# Patient Record
Sex: Female | Born: 1970 | Race: Black or African American | Hispanic: No | Marital: Married | Smoking: Never smoker
Health system: Southern US, Community
[De-identification: ages and names within clinical notes are randomized; demographics above are authoritative.]

## PROBLEM LIST (undated history)

## (undated) DIAGNOSIS — I10 Essential (primary) hypertension: Secondary | ICD-10-CM

## (undated) HISTORY — PX: NO PAST SURGERIES: SHX2092

---

## 1998-03-11 ENCOUNTER — Other Ambulatory Visit: Admission: RE | Admit: 1998-03-11 | Discharge: 1998-03-11 | Payer: Self-pay | Admitting: Obstetrics and Gynecology

## 2002-05-29 ENCOUNTER — Other Ambulatory Visit: Admission: RE | Admit: 2002-05-29 | Discharge: 2002-05-29 | Payer: Self-pay | Admitting: Obstetrics and Gynecology

## 2002-05-31 ENCOUNTER — Encounter: Admission: RE | Admit: 2002-05-31 | Discharge: 2002-05-31 | Payer: Self-pay | Admitting: Obstetrics and Gynecology

## 2002-05-31 ENCOUNTER — Encounter: Payer: Self-pay | Admitting: Obstetrics and Gynecology

## 2002-08-27 ENCOUNTER — Ambulatory Visit (HOSPITAL_COMMUNITY): Admission: RE | Admit: 2002-08-27 | Discharge: 2002-08-27 | Payer: Self-pay | Admitting: Obstetrics and Gynecology

## 2002-08-27 ENCOUNTER — Encounter: Payer: Self-pay | Admitting: Obstetrics and Gynecology

## 2003-03-22 ENCOUNTER — Ambulatory Visit (HOSPITAL_COMMUNITY): Admission: RE | Admit: 2003-03-22 | Discharge: 2003-03-22 | Payer: Self-pay | Admitting: Obstetrics and Gynecology

## 2003-03-22 ENCOUNTER — Encounter: Payer: Self-pay | Admitting: Obstetrics and Gynecology

## 2003-05-21 ENCOUNTER — Encounter: Admission: RE | Admit: 2003-05-21 | Discharge: 2003-08-19 | Payer: Self-pay | Admitting: Gynecology

## 2003-05-27 ENCOUNTER — Inpatient Hospital Stay (HOSPITAL_COMMUNITY): Admission: AD | Admit: 2003-05-27 | Discharge: 2003-05-27 | Payer: Self-pay | Admitting: Obstetrics and Gynecology

## 2003-05-28 ENCOUNTER — Inpatient Hospital Stay (HOSPITAL_COMMUNITY): Admission: AD | Admit: 2003-05-28 | Discharge: 2003-05-28 | Payer: Self-pay | Admitting: Obstetrics and Gynecology

## 2003-06-04 ENCOUNTER — Inpatient Hospital Stay (HOSPITAL_COMMUNITY): Admission: AD | Admit: 2003-06-04 | Discharge: 2003-06-04 | Payer: Self-pay | Admitting: Obstetrics and Gynecology

## 2003-06-04 ENCOUNTER — Encounter: Payer: Self-pay | Admitting: Obstetrics and Gynecology

## 2003-06-07 ENCOUNTER — Encounter: Payer: Self-pay | Admitting: Obstetrics and Gynecology

## 2003-06-07 ENCOUNTER — Ambulatory Visit (HOSPITAL_COMMUNITY): Admission: RE | Admit: 2003-06-07 | Discharge: 2003-06-07 | Payer: Self-pay | Admitting: Obstetrics and Gynecology

## 2003-06-11 ENCOUNTER — Encounter: Payer: Self-pay | Admitting: Obstetrics and Gynecology

## 2003-06-11 ENCOUNTER — Ambulatory Visit (HOSPITAL_COMMUNITY): Admission: RE | Admit: 2003-06-11 | Discharge: 2003-06-11 | Payer: Self-pay | Admitting: Pediatrics

## 2003-06-18 ENCOUNTER — Encounter: Payer: Self-pay | Admitting: Obstetrics and Gynecology

## 2003-06-18 ENCOUNTER — Inpatient Hospital Stay (HOSPITAL_COMMUNITY): Admission: AD | Admit: 2003-06-18 | Discharge: 2003-06-18 | Payer: Self-pay | Admitting: Obstetrics and Gynecology

## 2003-06-22 ENCOUNTER — Inpatient Hospital Stay (HOSPITAL_COMMUNITY): Admission: AD | Admit: 2003-06-22 | Discharge: 2003-06-22 | Payer: Self-pay | Admitting: Obstetrics and Gynecology

## 2003-06-22 ENCOUNTER — Encounter: Payer: Self-pay | Admitting: Obstetrics and Gynecology

## 2003-07-02 ENCOUNTER — Ambulatory Visit (HOSPITAL_COMMUNITY): Admission: RE | Admit: 2003-07-02 | Discharge: 2003-07-02 | Payer: Self-pay | Admitting: Obstetrics and Gynecology

## 2003-07-02 ENCOUNTER — Encounter: Payer: Self-pay | Admitting: Obstetrics and Gynecology

## 2003-07-11 ENCOUNTER — Inpatient Hospital Stay (HOSPITAL_COMMUNITY): Admission: AD | Admit: 2003-07-11 | Discharge: 2003-07-11 | Payer: Self-pay | Admitting: Obstetrics and Gynecology

## 2003-07-11 ENCOUNTER — Encounter: Payer: Self-pay | Admitting: Obstetrics and Gynecology

## 2003-07-11 ENCOUNTER — Inpatient Hospital Stay (HOSPITAL_COMMUNITY): Admission: AD | Admit: 2003-07-11 | Discharge: 2003-07-13 | Payer: Self-pay | Admitting: Obstetrics and Gynecology

## 2003-07-14 ENCOUNTER — Inpatient Hospital Stay (HOSPITAL_COMMUNITY): Admission: AD | Admit: 2003-07-14 | Discharge: 2003-07-14 | Payer: Self-pay | Admitting: Obstetrics and Gynecology

## 2003-08-21 ENCOUNTER — Other Ambulatory Visit: Admission: RE | Admit: 2003-08-21 | Discharge: 2003-08-21 | Payer: Self-pay | Admitting: Obstetrics and Gynecology

## 2004-08-21 ENCOUNTER — Other Ambulatory Visit: Admission: RE | Admit: 2004-08-21 | Discharge: 2004-08-21 | Payer: Self-pay | Admitting: Obstetrics and Gynecology

## 2006-02-28 ENCOUNTER — Other Ambulatory Visit: Admission: RE | Admit: 2006-02-28 | Discharge: 2006-02-28 | Payer: Self-pay | Admitting: Obstetrics and Gynecology

## 2012-10-25 ENCOUNTER — Other Ambulatory Visit: Payer: Self-pay | Admitting: Internal Medicine

## 2012-10-30 ENCOUNTER — Other Ambulatory Visit: Payer: Self-pay

## 2012-11-01 ENCOUNTER — Ambulatory Visit
Admission: RE | Admit: 2012-11-01 | Discharge: 2012-11-01 | Disposition: A | Discharge status: Home / Self Care | Payer: 59 | Source: Ambulatory Visit | Attending: Internal Medicine | Admitting: Internal Medicine

## 2013-11-23 ENCOUNTER — Other Ambulatory Visit: Payer: Self-pay | Admitting: Obstetrics and Gynecology

## 2013-11-29 ENCOUNTER — Other Ambulatory Visit (HOSPITAL_COMMUNITY): Payer: Self-pay | Admitting: Obstetrics and Gynecology

## 2013-11-29 NOTE — H&P (Signed)
Joyce Nelson is a 42 y.o.  female P 1-0-1-2 presents for hysteroscopic removal of a Mirena IUD, resection of a submucosal fibroid and replacement of an IUD because of a retained IUD.   The patient had a Mirena IUD placed five years ago that expired in September 2014.  When she came to have it removed under ultrasound guidance (since the string was not visible on exam)  in November, the IUD was found to be wedged within the uterine cavity by a submucosal fibroid. Ultrasound showed an anteflexed uterus: 8.3 x 7.80 x 6.16 cm,  endometrium-6.36 mm,  #5 uterine fibroids ranging from 1.11-6.40 cm including a submucosal fibroid measuring 2.84 x 2.19 x 2.6 cm with  the IUD in place anterior to the submucosal fibroid per 3D rendering.  Both ovaries appeared normal on this study. The patient denies any abnormal bleeding,  though since her IUD expired she's resumed her menses that lasts for 4 days, requires a pad change every 4 hours and minimal cramping.  She also denies pelvic pain, dyspareunia, bladder or bowel symptoms.  She does report left lower back pain that radiates down her leg but has been told that it is due to sciatica.  Given that the patient wants to received another IUD, she present for removal and replacement of IUD and resection of  a submucosal fibroid.   Past Medical History  OB History: G 1;  P 1-0-0-1;  History of infertility,  SVB 2004  GYN History: menarche: 42 YO;    LMP: 11/13/13;  Contracepton: IUD; Admits a  history of abnormal PAP smear, treated with laser therapy in 1994;  Last PAP smear: 05/2013  Medical History: Gestational diabetes and Hypertension  Surgical History: None Denies history of blood transfusions  Family History: Hypertension, migraines and vaginal cancer  Social History: Married and employed with [employer];  Denies tobacco, alcohol or illicit drug use.   Medications:   Benicar 40/12.5 daily  NKDA Denies sensitivity to peanuts, shellfish, soy, latex  or adhesives.   ROS: Admits to contact lenses and left lower back pain with sciatica;  Denies headache, vision changes, nasal congestion, dysphagia, tinnitus, dizziness, hoarseness, cough,  chest pain, shortness of breath, nausea, vomiting, diarrhea,constipation,  urinary frequency, urgency  dysuria, hematuria, vaginitis symptoms, pelvic pain, swelling of joints,easy bruising,  myalgias, skin rashes, unexplained weight loss and except as is mentioned in the history of present illness, patient's review of systems is otherwise negative.  Physical Exam  Bp: 120/76    P: 88    R: 12    Temperature: 98.8 degrees F orally   Weight: 187 lbs.  Height: 5'  BMI: 36.5  Neck: supple without masses or thyromegaly Lungs: clear to auscultation Heart: regular rate and rhythm Abdomen: non-tender mass arising from pelvis to approximately 2 fingers above symphysis pubis and no organomegaly Pelvic:EGBUS- wnl; vagina-normal rugae; uterus-12-14 weeks size and irregular, non-tender,  cervix without lesions or motion tenderness; adnexae-no tenderness or masses Extremities:  no clubbing, cyanosis or edema   Assesment:  Retained IUD            Submucosal Fibroid   Disposition:  A discussion was held with patient regarding the indication for her procedure(s) along with the risks, which include but are not limited to: reaction to anesthesia, damage to adjacent organs to include uterine perforation, infection, endometrial scarring  and excessive bleeding.  The patient verbalized understanding of these risks and has consented to proceed with a Hysteroscopic Removal of a   Mirena IUD, Resection of a Submucosal Fibroid and Replacement of a Mirena IUD at [facility] on December 07, 2013 at 7:30 a.m.   CSN# [id number]   Markea Ruzich J. Monaye Blackie, PA-C  for Dr.  Naima A. Dillard   

## 2013-12-04 ENCOUNTER — Encounter (HOSPITAL_COMMUNITY): Payer: Self-pay | Admitting: Pharmacist

## 2013-12-04 ENCOUNTER — Encounter (HOSPITAL_COMMUNITY)
Admission: RE | Admit: 2013-12-04 | Discharge: 2013-12-04 | Disposition: A | Discharge status: Home / Self Care | Payer: 59 | Source: Ambulatory Visit | Attending: Obstetrics and Gynecology | Admitting: Obstetrics and Gynecology

## 2013-12-04 ENCOUNTER — Encounter (HOSPITAL_COMMUNITY): Payer: Self-pay

## 2013-12-04 HISTORY — DX: Essential (primary) hypertension: I10

## 2013-12-04 LAB — CBC
HCT: 39.1 % (ref 36.0–46.0)
MCHC: 34.3 g/dL (ref 30.0–36.0)
MCV: 81.6 fL (ref 78.0–100.0)
RBC: 4.79 MIL/uL (ref 3.87–5.11)
RDW: 12.5 % (ref 11.5–15.5)
WBC: 6.7 10*3/uL (ref 4.0–10.5)

## 2013-12-04 LAB — BASIC METABOLIC PANEL
BUN: 8 mg/dL (ref 6–23)
CO2: 30 mEq/L (ref 19–32)
Calcium: 9.8 mg/dL (ref 8.4–10.5)
Chloride: 98 mEq/L (ref 96–112)
Creatinine, Ser: 0.71 mg/dL (ref 0.50–1.10)
GFR calc Af Amer: >90 mL/min (ref >90)
Glucose, Bld: 105 mg/dL — ABNORMAL HIGH (ref 70–99)

## 2013-12-04 NOTE — Patient Instructions (Signed)
22 Joyce Nelson  12/04/2013   Your procedure is scheduled on:  12/07/13  Enter through the Main Entrance of [facility] at 6 AM.  Pick up the phone at the desk and dial [phone].   Call this number if you have problems the morning of surgery: [phone]   Remember:   Do not eat food:After Midnight.  Do not drink clear liquids: After Midnight.  Take these medicines the morning of surgery with A SIP OF WATER: blood pressure   Do not wear jewelry, make-up or nail polish.  Do not wear lotions, powders, or perfumes. You may wear deodorant.  Do not shave 48 hours prior to surgery.  Do not bring valuables to the hospital.  [facility] is not   responsible for any belongings or valuables brought to the hospital.  Contacts, dentures or bridgework may not be worn into surgery.  Leave suitcase in the car. After surgery it may be brought to your room.  For patients admitted to the hospital, checkout time is 11:00 AM the day of              discharge.   Patients discharged the day of surgery will not be allowed to drive             home.  Name and phone number of your driver: mother Lysle Morales  Special Instructions:   Shower using CHG 2 nights before surgery and the night before surgery.  If you shower the day of surgery use CHG.  Use special wash - you have one bottle of CHG for all showers.  You should use approximately 1/3 of the bottle for each shower.   Please read over the following fact sheets that you were given:   Surgical Site Infection Prevention

## 2013-12-07 ENCOUNTER — Encounter (HOSPITAL_COMMUNITY): Payer: Self-pay | Admitting: Anesthesiology

## 2013-12-07 ENCOUNTER — Ambulatory Visit (HOSPITAL_COMMUNITY): Payer: 59

## 2013-12-07 ENCOUNTER — Inpatient Hospital Stay (HOSPITAL_COMMUNITY)
Admission: RE | Admit: 2013-12-07 | Discharge: 2013-12-08 | DRG: 742 | Disposition: A | Discharge status: Home / Self Care | Payer: 59 | Source: Ambulatory Visit | Attending: Obstetrics and Gynecology | Admitting: Obstetrics and Gynecology

## 2013-12-07 ENCOUNTER — Encounter (HOSPITAL_COMMUNITY)
Admission: RE | Disposition: A | Discharge status: Home / Self Care | Payer: Self-pay | Source: Ambulatory Visit | Attending: Obstetrics and Gynecology

## 2013-12-07 ENCOUNTER — Ambulatory Visit (HOSPITAL_COMMUNITY): Payer: 59 | Admitting: Anesthesiology

## 2013-12-07 ENCOUNTER — Encounter (HOSPITAL_COMMUNITY): Payer: 59 | Admitting: Anesthesiology

## 2013-12-07 DIAGNOSIS — I10 Essential (primary) hypertension: Secondary | ICD-10-CM | POA: Diagnosis present

## 2013-12-07 DIAGNOSIS — J988 Other specified respiratory disorders: Secondary | ICD-10-CM | POA: Diagnosis present

## 2013-12-07 DIAGNOSIS — J810 Acute pulmonary edema: Secondary | ICD-10-CM | POA: Diagnosis not present

## 2013-12-07 DIAGNOSIS — Z30432 Encounter for removal of intrauterine contraceptive device: Secondary | ICD-10-CM

## 2013-12-07 DIAGNOSIS — R0989 Other specified symptoms and signs involving the circulatory and respiratory systems: Secondary | ICD-10-CM | POA: Diagnosis present

## 2013-12-07 DIAGNOSIS — D250 Submucous leiomyoma of uterus: Principal | ICD-10-CM | POA: Diagnosis present

## 2013-12-07 HISTORY — PX: DILATATION & CURETTAGE/HYSTEROSCOPY WITH TRUECLEAR: SHX6353

## 2013-12-07 LAB — COMPREHENSIVE METABOLIC PANEL
CO2: 28 mEq/L (ref 19–32)
Calcium: 8.5 mg/dL (ref 8.4–10.5)
Creatinine, Ser: 0.77 mg/dL (ref 0.50–1.10)
GFR calc Af Amer: >90 mL/min (ref >90)
GFR calc non Af Amer: >90 mL/min (ref >90)
Glucose, Bld: 153 mg/dL — ABNORMAL HIGH (ref 70–99)
Potassium: 4.1 mEq/L (ref 3.5–5.1)
Sodium: 136 mEq/L (ref 135–145)
Total Protein: 6.7 g/dL (ref 6.0–8.3)

## 2013-12-07 LAB — CBC
Hemoglobin: 12.8 g/dL (ref 12.0–15.0)
MCH: 28.7 pg (ref 26.0–34.0)
MCHC: 34.7 g/dL (ref 30.0–36.0)
MCV: 82.7 fL (ref 78.0–100.0)
RBC: 4.46 MIL/uL (ref 3.87–5.11)
RDW: 12.7 % (ref 11.5–15.5)

## 2013-12-07 LAB — PREGNANCY, URINE: Preg Test, Ur: NEGATIVE

## 2013-12-07 LAB — MRSA PCR SCREENING: MRSA by PCR: NEGATIVE

## 2013-12-07 SURGERY — DILATATION & CURETTAGE/HYSTEROSCOPY WITH TRUCLEAR
Anesthesia: General | Site: Abdomen

## 2013-12-07 MED ORDER — MIDAZOLAM HCL 2 MG/2ML IJ SOLN
INTRAMUSCULAR | Status: AC
  Filled 2013-12-07: qty 2

## 2013-12-07 MED ORDER — HYDROCHLOROTHIAZIDE 12.5 MG PO CAPS
12.5000 mg | ORAL_CAPSULE | Freq: Every day | ORAL | Status: DC
  Administered 2013-12-08: 12.5 mg via ORAL
  Filled 2013-12-07: qty 1

## 2013-12-07 MED ORDER — PROPOFOL 10 MG/ML IV BOLUS
INTRAVENOUS | Status: DC | PRN
  Administered 2013-12-07: 200 mg via INTRAVENOUS
  Administered 2013-12-07: 50 mg via INTRAVENOUS

## 2013-12-07 MED ORDER — METOCLOPRAMIDE HCL 5 MG/ML IJ SOLN: 10.0000 mg | Freq: Once | INTRAMUSCULAR | Status: DC | PRN

## 2013-12-07 MED ORDER — DEXTROSE IN LACTATED RINGERS 5 % IV SOLN
INTRAVENOUS | Status: DC
  Administered 2013-12-07: 10 mL/h via INTRAVENOUS

## 2013-12-07 MED ORDER — OLMESARTAN MEDOXOMIL-HCTZ 40-12.5 MG PO TABS: 1.0000 | ORAL_TABLET | Freq: Every day | ORAL | Status: DC

## 2013-12-07 MED ORDER — SODIUM CHLORIDE 0.9 % IR SOLN
Status: DC | PRN
  Administered 2013-12-07: 6000 mL

## 2013-12-07 MED ORDER — FUROSEMIDE 10 MG/ML IJ SOLN
INTRAMUSCULAR | Status: AC
  Filled 2013-12-07: qty 2

## 2013-12-07 MED ORDER — ALBUTEROL SULFATE (5 MG/ML) 0.5% IN NEBU
INHALATION_SOLUTION | RESPIRATORY_TRACT | Status: AC
  Administered 2013-12-07: 2.5 mg via RESPIRATORY_TRACT
  Filled 2013-12-07: qty 0.5

## 2013-12-07 MED ORDER — LIDOCAINE HCL (CARDIAC) 20 MG/ML IV SOLN
INTRAVENOUS | Status: AC
  Filled 2013-12-07: qty 5

## 2013-12-07 MED ORDER — DEXAMETHASONE SODIUM PHOSPHATE 10 MG/ML IJ SOLN
INTRAMUSCULAR | Status: AC
  Filled 2013-12-07: qty 1

## 2013-12-07 MED ORDER — LIDOCAINE HCL 2 % IJ SOLN
INTRAMUSCULAR | Status: AC
  Filled 2013-12-07: qty 20

## 2013-12-07 MED ORDER — KETOROLAC TROMETHAMINE 30 MG/ML IJ SOLN: 15.0000 mg | Freq: Once | INTRAMUSCULAR | Status: DC | PRN

## 2013-12-07 MED ORDER — ALBUTEROL SULFATE (5 MG/ML) 0.5% IN NEBU
2.5000 mg | INHALATION_SOLUTION | Freq: Once | RESPIRATORY_TRACT | Status: AC
  Administered 2013-12-07: 2.5 mg via RESPIRATORY_TRACT

## 2013-12-07 MED ORDER — FENTANYL CITRATE 0.05 MG/ML IJ SOLN
INTRAMUSCULAR | Status: AC
  Filled 2013-12-07: qty 2

## 2013-12-07 MED ORDER — ONDANSETRON HCL 4 MG/2ML IJ SOLN
INTRAMUSCULAR | Status: AC
  Filled 2013-12-07: qty 2

## 2013-12-07 MED ORDER — PHENYLEPHRINE 40 MCG/ML (10ML) SYRINGE FOR IV PUSH (FOR BLOOD PRESSURE SUPPORT)
PREFILLED_SYRINGE | INTRAVENOUS | Status: AC
  Filled 2013-12-07: qty 5

## 2013-12-07 MED ORDER — DEXAMETHASONE SODIUM PHOSPHATE 10 MG/ML IJ SOLN
INTRAMUSCULAR | Status: DC | PRN
  Administered 2013-12-07: 10 mg via INTRAVENOUS

## 2013-12-07 MED ORDER — LACTATED RINGERS IV SOLN
INTRAVENOUS | Status: DC
  Administered 2013-12-07 (×3): via INTRAVENOUS

## 2013-12-07 MED ORDER — IRBESARTAN 300 MG PO TABS
300.0000 mg | ORAL_TABLET | Freq: Every day | ORAL | Status: DC
  Filled 2013-12-07: qty 1

## 2013-12-07 MED ORDER — FUROSEMIDE 10 MG/ML IJ SOLN
INTRAMUSCULAR | Status: DC | PRN
  Administered 2013-12-07: 10 mg via INTRAMUSCULAR

## 2013-12-07 MED ORDER — LIDOCAINE HCL 2 % IJ SOLN
INTRAMUSCULAR | Status: DC | PRN
  Administered 2013-12-07: 15 mL

## 2013-12-07 MED ORDER — PHENYLEPHRINE HCL 10 MG/ML IJ SOLN
INTRAMUSCULAR | Status: DC | PRN
  Administered 2013-12-07: 120 ug via INTRAVENOUS
  Administered 2013-12-07 (×4): 80 ug via INTRAVENOUS
  Administered 2013-12-07 (×2): 120 ug via INTRAVENOUS
  Administered 2013-12-07 (×3): 80 ug via INTRAVENOUS

## 2013-12-07 MED ORDER — FENTANYL CITRATE 0.05 MG/ML IJ SOLN
INTRAMUSCULAR | Status: DC | PRN
  Administered 2013-12-07: 100 ug via INTRAVENOUS
  Administered 2013-12-07 (×2): 50 ug via INTRAVENOUS

## 2013-12-07 MED ORDER — ZOLPIDEM TARTRATE 5 MG PO TABS
5.0000 mg | ORAL_TABLET | Freq: Every evening | ORAL | Status: DC | PRN
  Administered 2013-12-07: 5 mg via ORAL
  Filled 2013-12-07: qty 1

## 2013-12-07 MED ORDER — SODIUM CHLORIDE 0.9 % IJ SOLN
3.0000 mL | Freq: Two times a day (BID) | INTRAMUSCULAR | Status: DC
  Administered 2013-12-07: 3 mL via INTRAVENOUS

## 2013-12-07 MED ORDER — FENTANYL CITRATE 0.05 MG/ML IJ SOLN: 25.0000 ug | INTRAMUSCULAR | Status: DC | PRN

## 2013-12-07 MED ORDER — MORPHINE SULFATE 4 MG/ML IJ SOLN
1.0000 mg | INTRAMUSCULAR | Status: DC | PRN
  Administered 2013-12-07 (×2): 2 mg via INTRAVENOUS

## 2013-12-07 MED ORDER — MORPHINE SULFATE 4 MG/ML IJ SOLN
INTRAMUSCULAR | Status: AC
  Administered 2013-12-07: 2 mg via INTRAVENOUS
  Filled 2013-12-07: qty 1

## 2013-12-07 MED ORDER — MEPERIDINE HCL 25 MG/ML IJ SOLN: 6.2500 mg | INTRAMUSCULAR | Status: DC | PRN

## 2013-12-07 MED ORDER — MENTHOL 3 MG MT LOZG: 1.0000 | LOZENGE | OROMUCOSAL | Status: DC | PRN

## 2013-12-07 MED ORDER — MIDAZOLAM HCL 2 MG/2ML IJ SOLN
INTRAMUSCULAR | Status: DC | PRN
  Administered 2013-12-07 (×2): 2 mg via INTRAVENOUS

## 2013-12-07 MED ORDER — LIDOCAINE HCL (CARDIAC) 20 MG/ML IV SOLN
INTRAVENOUS | Status: DC | PRN
  Administered 2013-12-07: 100 mg via INTRAVENOUS

## 2013-12-07 MED ORDER — DIPHENHYDRAMINE HCL 50 MG/ML IJ SOLN
INTRAMUSCULAR | Status: DC | PRN
  Administered 2013-12-07: 25 mg via INTRAVENOUS

## 2013-12-07 MED ORDER — KETOROLAC TROMETHAMINE 30 MG/ML IJ SOLN
30.0000 mg | Freq: Four times a day (QID) | INTRAMUSCULAR | Status: DC
  Filled 2013-12-07: qty 1

## 2013-12-07 MED ORDER — PROPOFOL 10 MG/ML IV EMUL
INTRAVENOUS | Status: AC
  Filled 2013-12-07: qty 20

## 2013-12-07 MED ORDER — KETOROLAC TROMETHAMINE 30 MG/ML IJ SOLN
30.0000 mg | Freq: Four times a day (QID) | INTRAMUSCULAR | Status: DC
  Administered 2013-12-07: 30 mg via INTRAMUSCULAR

## 2013-12-07 MED ORDER — IBUPROFEN 600 MG PO TABS
600.0000 mg | ORAL_TABLET | Freq: Four times a day (QID) | ORAL | Status: DC | PRN
  Administered 2013-12-07 – 2013-12-08 (×2): 600 mg via ORAL
  Filled 2013-12-07 (×2): qty 1

## 2013-12-07 MED ORDER — HYDROCODONE-ACETAMINOPHEN 5-325 MG PO TABS
1.0000 | ORAL_TABLET | ORAL | Status: DC | PRN
  Administered 2013-12-08: 1 via ORAL
  Filled 2013-12-07: qty 1

## 2013-12-07 SURGICAL SUPPLY — 21 items
BLADE INCISOR TRUC PLUS 2.9 (ABLATOR) ×1 IMPLANT
CANISTERS HI-FLOW 3000CC (CANNISTER) ×2 IMPLANT
CATH ROBINSON RED A/P 16FR (CATHETERS) ×2 IMPLANT
CLOTH BEACON ORANGE TIMEOUT ST (SAFETY) ×2 IMPLANT
CONTAINER PREFILL 10% NBF 60ML (FORM) ×2 IMPLANT
DRAPE HYSTEROSCOPY (DRAPE) ×4 IMPLANT
DRSG TELFA 3X8 NADH (GAUZE/BANDAGES/DRESSINGS) ×2 IMPLANT
GLOVE BIO SURGEON STRL SZ 6.5 (GLOVE) ×2 IMPLANT
GLOVE BIOGEL PI IND STRL 7.0 (GLOVE) ×3 IMPLANT
GLOVE BIOGEL PI INDICATOR 7.0 (GLOVE) ×3
GOWN STRL REIN XL XLG (GOWN DISPOSABLE) ×4 IMPLANT
INCISOR TRUC PLUS BLADE 2.9 (ABLATOR) ×2
KIT HYSTEROSCOPY TRUCLEAR (ABLATOR) ×2 IMPLANT
MORCELLATOR RECIP TRUCLEAR 4.0 (ABLATOR) ×2 IMPLANT
NEEDLE SPNL 22GX3.5 QUINCKE BK (NEEDLE) ×2 IMPLANT
PACK VAGINAL MINOR WOMEN LF (CUSTOM PROCEDURE TRAY) ×2 IMPLANT
PAD OB MATERNITY 4.3X12.25 (PERSONAL CARE ITEMS) ×2 IMPLANT
SYR 20CC LL (SYRINGE) ×2 IMPLANT
TOWEL OR 17X24 6PK STRL BLUE (TOWEL DISPOSABLE) ×4 IMPLANT
TRAY FOLEY CATH 14FR (SET/KITS/TRAYS/PACK) ×2 IMPLANT
WATER STERILE IRR 1000ML POUR (IV SOLUTION) ×2 IMPLANT

## 2013-12-07 NOTE — Transfer of Care (Signed)
Immediate Anesthesia Transfer of Care Note  Patient: Joyce Nelson  Procedure(s) Performed: Procedure(s) with comments: DILATATION & CURETTAGE/HYSTEROSCOPY WITH TRUCLEAR (N/A) - REQUEST;APRIL,CHARLES ETTA,ANNETTE  Patient Location: PACU  Anesthesia Type:General  Level of Consciousness: awake, alert  and oriented  Airway & Oxygen Therapy: Patient Spontanous Breathing and Patient connected to nasal cannula oxygen  Post-op Assessment: Report given to PACU RN and Post -op Vital signs reviewed and stable  Post vital signs: Reviewed and stable  Complications: unexpected post-op hospitalization

## 2013-12-07 NOTE — Transfer of Care (Deleted)
Immediate Anesthesia Transfer of Care Note  Patient: Joyce Nelson  Procedure(s) Performed: Procedure(s) with comments: DILATATION & CURETTAGE/HYSTEROSCOPY WITH TRUCLEAR (N/A) - REQUEST;APRIL,CHARLES ETTA,ANNETTE  Patient Location: PACU  Anesthesia Type:General  Level of Consciousness: awake, alert , oriented and responds to stimulation  Airway & Oxygen Therapy: Patient Spontanous Breathing and Patient connected to face mask oxygen  Post-op Assessment: Report given to PACU RN and Post -op Vital signs reviewed and stable  Post vital signs: Reviewed and stable  Complications: No apparent anesthesia complications

## 2013-12-07 NOTE — Anesthesia Postprocedure Evaluation (Signed)
  Anesthesia Post-op Note  Patient: Joyce Nelson  Procedure(s) Performed: Procedure(s) with comments: DILATATION & CURETTAGE/HYSTEROSCOPY WITH TRUCLEAR (N/A) - REQUEST;APRIL,CHARLES ETTA,ANNETTE  Patient Location: PACU  Anesthesia Type:General  Level of Consciousness: awake, alert  and oriented  Airway and Oxygen Therapy: Patient Spontanous Breathing and Patient connected to nasal cannula oxygen  Post-op Pain: mild  Post-op Assessment: Post-op Vital signs reviewed, Patient's Cardiovascular Status Stable, Respiratory Function Stable, Patent Airway, No signs of Nausea or vomiting and Pain level controlled  Post-op Vital Signs: Reviewed and stable  Complications: Patient developed airway obstruction after procedure with transient desaturation. Responded to treatment. There was some question of possible post obstructive pulmonary edema, however CXR did not reveal this. She will be admitted to the hospital for observation. Dr. Normand Sloop wants patient to go to ICU post op for monitoring.

## 2013-12-07 NOTE — Addendum Note (Signed)
Addendum created 12/07/13 1459 by Armanda Heritage, CRNA   Modules edited: Anesthesia Events

## 2013-12-07 NOTE — Anesthesia Procedure Notes (Signed)
Procedure Name: LMA Insertion Date/Time: 12/07/2013 7:41 AM Performed by: Emaly Boschert, Jannet Askew Pre-anesthesia Checklist: Patient identified, Timeout performed, Emergency Drugs available, Suction available and Patient being monitored Patient Re-evaluated:Patient Re-evaluated prior to inductionOxygen Delivery Method: Circle system utilized Preoxygenation: Pre-oxygenation with 100% oxygen Intubation Type: IV induction LMA Size: 4.0 Number of attempts: 2 Placement Confirmation: positive ETCO2 and breath sounds checked- equal and bilateral Tube secured with: Tape (at lips) Dental Injury: Teeth and Oropharynx as per pre-operative assessment

## 2013-12-07 NOTE — Interval H&P Note (Signed)
History and Physical Interval Note:  12/07/2013 7:24 AM  Joyce Nelson  has presented today for surgery, with the diagnosis of Fibroids  The various methods of treatment have been discussed with the patient and family. After consideration of risks, benefits and other options for treatment, the patient has consented to  Procedure(s) with comments: DILATATION & CURETTAGE/HYSTEROSCOPY WITH TRUCLEAR (N/A) - REQUEST;APRIL,CHARLES ETTA,ANNETTE as a surgical intervention .  The patient's history has been reviewed, patient examined, no change in status, stable for surgery.  I have reviewed the patient's chart and labs.  Questions were answered to the patient's satisfaction.  The pt no longer wants replacement of the IUD   [facility] A

## 2013-12-07 NOTE — Op Note (Signed)
Pre op DX: Fibroids and retained Mirena   Post Op WU:JWJX with airway obstruciton   PHYSICIAN : Vance Belcourt   ASSISTANTS: none   ANESTHESIA:   LMA and paracervical block  ESTIMATED BLOOD LOSS: minimal  LOCAL MEDICATIONS USED:  LIDOCAINE 20CC  SPECIMEN:  Source of Specimen:  Fibroid curretings  DISPOSITION OF SPECIMEN:  PATHOLOGY  COUNTS Correct:  YES    DICTATION #: The patient was taken to the operating room and prepped and draped in a normal sterile fashion. An in out catheter was used to drain the bladder.   A bivalve speculum was placed into the vagina and anterior lip of the cervix was grasped with a single-tooth tenaculum.  20 cc of 1% lidocaine was used for cervical block.  the cervix was then dilated with Shawnie Pons dilators up to 17. The hysteroscope was placed into the uterine cavity. The  entire uterus and both ostia were visualized. There is a 2 cm fibroid seen on the anterior wall of the uterus. It was a small probably 1 cm fibroid seen at the fundus. IUD was visualized.  Ostia were visualized.  Grasper was placed through the channel on the true clear scope and it was not long enough to grab the IUD string. The hysteroscope was removed and polyp forceps were used to remove the IUD without difficulty. The true care was placed back into the uterus and I began to morcellate the large fibroid. The deficit is went to 800 cc immediately appear her was no sign of perforation. A small bleed on the true clear was not adequately resecting the fibroids I dilated the uterus up to the cervix up to 33 to him recalibrated with the obturator into the cervix and uterus and used the large scope. Then began using the large blade and resected some 50% of the large uterine fibroid on the anterior wall of the uterus appear this time the nurse anesthetist stated that the patient was having some difficulty maintaining her oxygen  saturation and blood pressure. Manually looked at the deficit and there was  a total of 1500 cc saline. As of the difficulty that the ventilating and maintaining blood pressure adequate saturation level I discontinued the procedure. Anesthesiologist Dr. Malen Gauze then came in and him in the anesthetist along with myself and the team in the room help to maintain the patient's airway and its increase her increase her saturation level back to normal-appearing she went to PACU fascia chest x-ray was given Lasix and checking electrolytes. A low suspicion of a pulmonary embolus because of the return of saturation level the patient will be admitted to ICU and monitored closely and depending on what we find with the electrolytes and chest x-ray she may need a spiral CT to rule out pulmonary embolus. The patient responded well to ventilation and nebulizer treatment. The shavings from the fibroid were sent to pathology and the IUD was thrown away.    Hyseroscope was then removed from the uterus. l The tenaculum was removed from the cervix and hemostasis was noted.   PLAN OF CARE: discharge to home  PATIENT DISPOSITION:  PACU - hemodynamically stable.

## 2013-12-07 NOTE — H&P (View-Only) (Signed)
Joyce Nelson is a 42 y.o.  female P 1-0-1-2 presents for hysteroscopic removal of a Mirena IUD, resection of a submucosal fibroid and replacement of an IUD because of a retained IUD.   The patient had a Mirena IUD placed five years ago that expired in September 2014.  When she came to have it removed under ultrasound guidance (since the string was not visible on exam)  in November, the IUD was found to be wedged within the uterine cavity by a submucosal fibroid. Ultrasound showed an anteflexed uterus: 8.3 x 7.80 x 6.16 cm,  endometrium-6.36 mm,  #5 uterine fibroids ranging from 1.11-6.40 cm including a submucosal fibroid measuring 2.84 x 2.19 x 2.6 cm with  the IUD in place anterior to the submucosal fibroid per 3D rendering.  Both ovaries appeared normal on this study. The patient denies any abnormal bleeding,  though since her IUD expired she's resumed her menses that lasts for 4 days, requires a pad change every 4 hours and minimal cramping.  She also denies pelvic pain, dyspareunia, bladder or bowel symptoms.  She does report left lower back pain that radiates down her leg but has been told that it is due to sciatica.  Given that the patient wants to received another IUD, she present for removal and replacement of IUD and resection of  a submucosal fibroid.   Past Medical History  OB History: G 1;  P 1-0-0-1;  History of infertility,  SVB 2004  GYN History: menarche: 42 YO;    LMP: 11/13/13;  Contracepton: IUD; Admits a  history of abnormal PAP smear, treated with laser therapy in 1994;  Last PAP smear: 05/2013  Medical History: Gestational diabetes and Hypertension  Surgical History: None Denies history of blood transfusions  Family History: Hypertension, migraines and vaginal cancer  Social History: Married and employed with [employer];  Denies tobacco, alcohol or illicit drug use.   Medications:   Benicar 40/12.5 daily  NKDA Denies sensitivity to peanuts, shellfish, soy, latex  or adhesives.   ROS: Admits to contact lenses and left lower back pain with sciatica;  Denies headache, vision changes, nasal congestion, dysphagia, tinnitus, dizziness, hoarseness, cough,  chest pain, shortness of breath, nausea, vomiting, diarrhea,constipation,  urinary frequency, urgency  dysuria, hematuria, vaginitis symptoms, pelvic pain, swelling of joints,easy bruising,  myalgias, skin rashes, unexplained weight loss and except as is mentioned in the history of present illness, patient's review of systems is otherwise negative.  Physical Exam  Bp: 120/76    P: 88    R: 12    Temperature: 98.8 degrees F orally   Weight: 187 lbs.  Height: 5'  BMI: 36.5  Neck: supple without masses or thyromegaly Lungs: clear to auscultation Heart: regular rate and rhythm Abdomen: non-tender mass arising from pelvis to approximately 2 fingers above symphysis pubis and no organomegaly Pelvic:EGBUS- wnl; vagina-normal rugae; uterus-12-14 weeks size and irregular, non-tender,  cervix without lesions or motion tenderness; adnexae-no tenderness or masses Extremities:  no clubbing, cyanosis or edema   Assesment:  Retained IUD            Submucosal Fibroid   Disposition:  A discussion was held with patient regarding the indication for her procedure(s) along with the risks, which include but are not limited to: reaction to anesthesia, damage to adjacent organs to include uterine perforation, infection, endometrial scarring  and excessive bleeding.  The patient verbalized understanding of these risks and has consented to proceed with a Hysteroscopic Removal of a  Mirena IUD, Resection of a Submucosal Fibroid and Replacement of a Mirena IUD at [facility] [place] on December 07, 2013 at 7:30 a.m.   CSN# [id number]   Laurali Goddard J. Lowell Guitar, PA-C  for Dr.  Pierre Bali. Dillard

## 2013-12-07 NOTE — Anesthesia Preprocedure Evaluation (Signed)
Anesthesia Evaluation  Patient identified by MRN, date of birth, ID band Patient awake    Reviewed: Allergy & Precautions, H&P , NPO status , Patient's Chart, lab work & pertinent test results, reviewed documented beta blocker date and time   History of Anesthesia Complications Negative for: history of anesthetic complications  Airway Mallampati: II TM Distance: >3 FB Neck ROM: full    Dental  (+) Teeth Intact   Pulmonary neg pulmonary ROS,  breath sounds clear to auscultation  Pulmonary exam normal       Cardiovascular Exercise Tolerance: Good hypertension, On Medications Rhythm:regular Rate:Normal     Neuro/Psych negative neurological ROS  negative psych ROS   GI/Hepatic negative GI ROS, Neg liver ROS,   Endo/Other  BMI 35.6   Renal/GU negative Renal ROS  Female GU complaint     Musculoskeletal   Abdominal   Peds  Hematology negative hematology ROS (+)   Anesthesia Other Findings   Reproductive/Obstetrics negative OB ROS                           Anesthesia Physical Anesthesia Plan  ASA: II  Anesthesia Plan: General LMA   Post-op Pain Management:    Induction:   Airway Management Planned:   Additional Equipment:   Intra-op Plan:   Post-operative Plan:   Informed Consent: I have reviewed the patients History and Physical, chart, labs and discussed the procedure including the risks, benefits and alternatives for the proposed anesthesia with the patient or authorized representative who has indicated his/her understanding and acceptance.   Dental Advisory Given  Plan Discussed with: CRNA and Surgeon  Anesthesia Plan Comments:         Anesthesia Quick Evaluation

## 2013-12-07 NOTE — Progress Notes (Signed)
UR chart review completed.  

## 2013-12-08 ENCOUNTER — Inpatient Hospital Stay (HOSPITAL_COMMUNITY): Payer: 59

## 2013-12-08 LAB — BASIC METABOLIC PANEL
Calcium: 8.5 mg/dL (ref 8.4–10.5)
Chloride: 99 mEq/L (ref 96–112)
Creatinine, Ser: 0.72 mg/dL (ref 0.50–1.10)
GFR calc Af Amer: >90 mL/min (ref >90)
Glucose, Bld: 130 mg/dL — ABNORMAL HIGH (ref 70–99)

## 2013-12-08 MED ORDER — IBUPROFEN 600 MG PO TABS: 600.0000 mg | ORAL_TABLET | Freq: Four times a day (QID) | ORAL | Status: AC | PRN

## 2013-12-08 NOTE — Discharge Summary (Signed)
Physician Discharge Summary  Patient ID: Joyce Nelson MRN: [id number] DOB/AGE: 1971/11/24 42 y.o.  Admit date: 12/07/2013 Discharge date: 12/08/2013  Admission Diagnoses:1 uterine fibroid 2. Hypertension   Discharge Diagnoses: Same Plus  Active Problems:   Airway compromise   Discharged Condition: stable  Hospital Course: pt underwent hystereoscopy D&C  With resection of uterine fibroid by dr. Jaymes Graff on 12/07/2013. When the patient was extubated she exhibited respiratory distress. The was due to suspected pulmonary edema. She received 10 mg of lasix and respiratory function improved.   Consults: None  Significant Diagnostic Studies: radiology: CXR mild difuse infiltrates   Treatments: surgery: hysteroscopy D&C resection of uterine fibroid and IV lasix.   Discharge Exam: Blood pressure 116/70, pulse 86, temperature 98.6 F (37 C), temperature source Oral, resp. rate 22, height 5' (1.524 m), weight 82.192 kg (181 lb 3.2 oz), last menstrual period 11/13/2013, SpO2 96.00%.   Disposition:   Discharge Orders   Future Orders Complete By Expires   Call MD for:  difficulty breathing, headache or visual disturbances  As directed    Call MD for:  persistant nausea and vomiting  As directed    Call MD for:  severe uncontrolled pain  As directed    Call MD for:  temperature >100.4  As directed    Diet - low sodium heart healthy  As directed    Increase activity slowly  As directed    Sexual Activity Restrictions  As directed    Comments:     Avoid sex  For 2 weeks       Medication List         acetaminophen 500 MG tablet  Commonly known as:  TYLENOL  Take 1,000 mg by mouth every 6 (six) hours as needed for mild pain or moderate pain.     Feverfew 380 MG Caps  Take 1 capsule by mouth daily.     folic acid 800 MCG tablet  Commonly known as:  FOLVITE  Take 800 mcg by mouth daily.     ibuprofen 600 MG tablet  Commonly known as:  ADVIL,MOTRIN  Take 1 tablet (600  mg total) by mouth every 6 (six) hours as needed (mild pain).     Melatonin 1 MG Tabs  Take 1 tablet by mouth as needed.     olmesartan-hydrochlorothiazide 40-12.5 MG per tablet  Commonly known as:  BENICAR HCT  Take 1 tablet by mouth daily.     psyllium 0.52 G capsule  Commonly known as:  REGULOID  Take 3 capsules by mouth daily.           Follow-up Information   Follow up with [facility], MD On 12/19/2013.   Specialty:  Obstetrics and Gynecology   Contact information:   [address] [place] [address] [phone]       Signed: Jessee Avers. 12/08/2013, 8:43 AM

## 2013-12-08 NOTE — Progress Notes (Signed)
1 Day Post-Op  Subjective: Patient reports slight cough but this has improved. She had a cold 2-3 wks ago... Denies pelvic pain   Objective: Vital signs in last 24 hours: Temp:  [97.3 F (36.3 C)-98.8 F (37.1 C)] 98.6 F (37 C) (12/20 0742) Pulse Rate:  [81-111] 86 (12/20 0803) Resp:  [14-27] 22 (12/20 0800) BP: (90-136)/(45-115) 116/70 mmHg (12/20 0800) SpO2:  [91 %-100 %] 96 % (12/20 0803) Weight:  [82.192 kg (181 lb 3.2 oz)] 82.192 kg (181 lb 3.2 oz) (12/19 1200) Last BM Date: 12/06/13  Intake/Output from previous day: 12/19 0701 - 12/20 0700 In: 3058.7 [P.O.:940; I.V.:2118.7] Out: 3125 [Urine:3125] Intake/Output this shift: Total I/O In: 240 [P.O.:240] Out: 225 [Urine:225]  General Alert and oriented no acute distress  CV lungs clear bilaterally no crackles noted  Abdomen soft nontender  Ext no edema   Lab Results:   Recent Labs  12/07/13 0955  WBC 6.9  HGB 12.8  HCT 36.9  PLT 221   BMET  Recent Labs  12/07/13 0955 12/08/13 0513  NA 136 136  K 4.1 3.6  CL 101 99  CO2 28 26  GLUCOSE 153* 130*  BUN 11 11  CREATININE 0.77 0.72  CALCIUM 8.5 8.5   PT/INR No results found for this basename: LABPROT, INR,  in the last 72 hours ABG No results found for this basename: PHART, PCO2, PO2, HCO3,  in the last 72 hours  Studies/Results: Dg Chest Port 1 View  12/07/2013   CLINICAL DATA:  Postop.  Pulmonary edema.  EXAM: PORTABLE CHEST - 1 VIEW  COMPARISON:  None.  FINDINGS: Heart size is within normal limits. Mild diffuse interstitial infiltrates are seen, suspicious for interstitial edema. No evidence of pulmonary airspace disease or consolidation. No evidence of pleural effusion or pneumothorax.  IMPRESSION: Mild diffuse interstitial infiltrates, suspicious for interstitial edema.   Electronically Signed   By: Myles Rosenthal M.D.   On: 12/07/2013 10:11    Anti-infectives: Anti-infectives   None      Assessment/Plan: s/p Procedure(s) with  comments: DILATATION & CURETTAGE/HYSTEROSCOPY WITH TRUCLEAR (N/A) - REQUEST;APRIL,CHARLES ETTA,ANNETTE Chest congestion suggested mucinex extra strength  Discharge home today follow up with Dr. Normand Sloop in 1-2 wks sooner if needed   LOS: 1 day    Noa Galvao J. 12/08/2013

## 2013-12-10 ENCOUNTER — Encounter (HOSPITAL_COMMUNITY): Payer: Self-pay | Admitting: Obstetrics and Gynecology

## 2014-04-19 IMAGING — US US ABDOMEN COMPLETE
1 series · 14 of 25 positions shown · non-contrast
Comparison: None.

CLINICAL DATA: Elevated liver function test.

COMPLETE ABDOMINAL ULTRASOUND

[Series 1: us abdomen complete · 0.24mm/px · 14 of 63 slices shown]
[im 1/63]
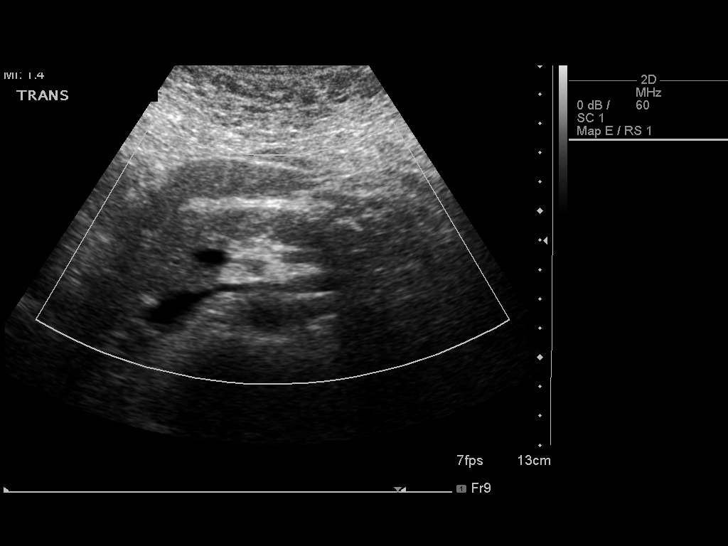
[im 6/63]
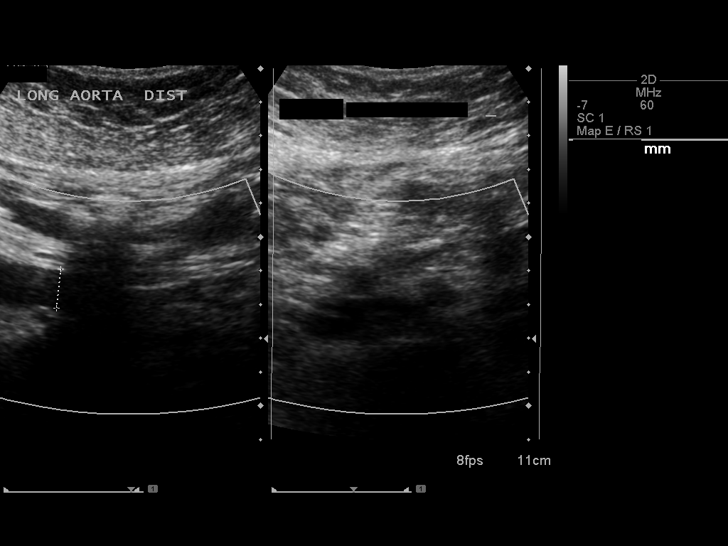
[im 11/63]
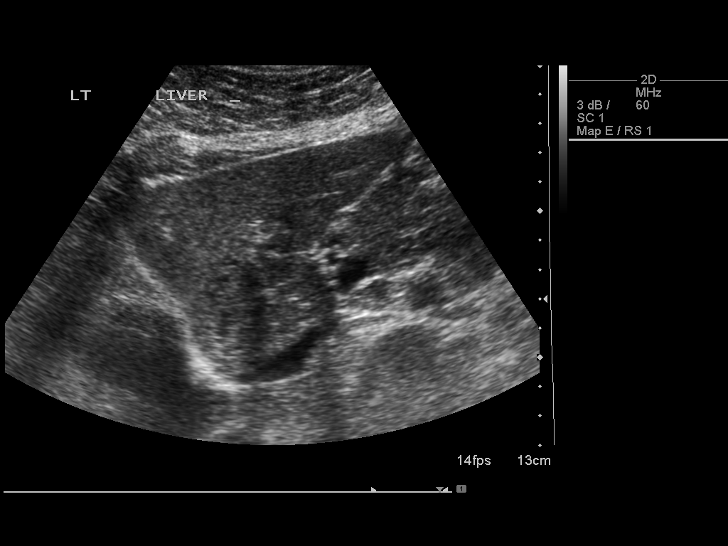
[im 16/63]
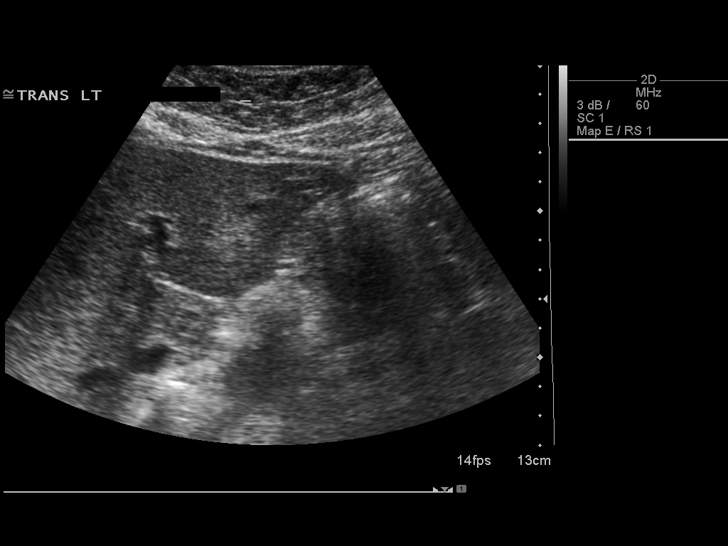
[im 21/63]
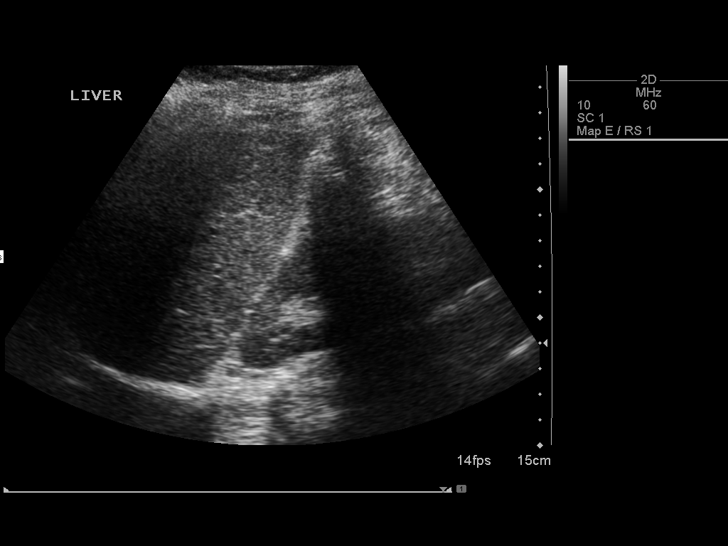
[im 24/63]
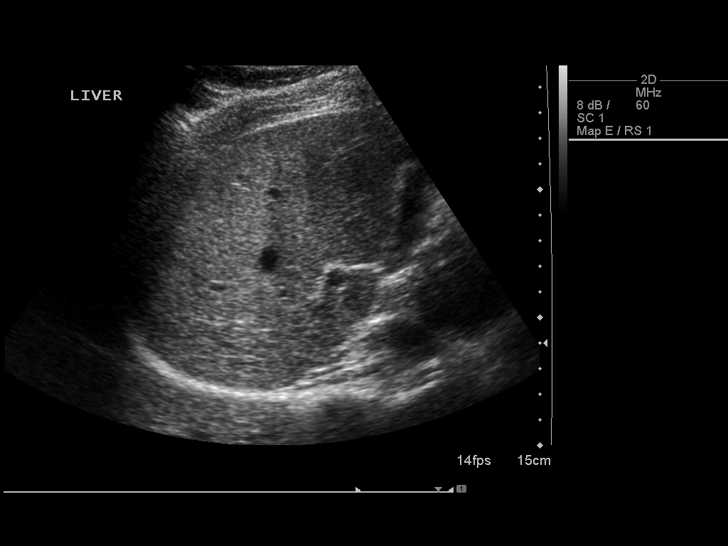
[im 29/63]
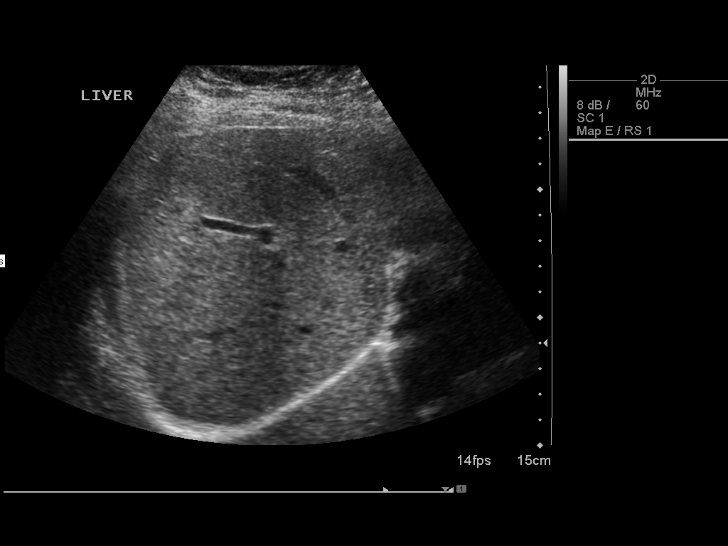
[im 34/63]
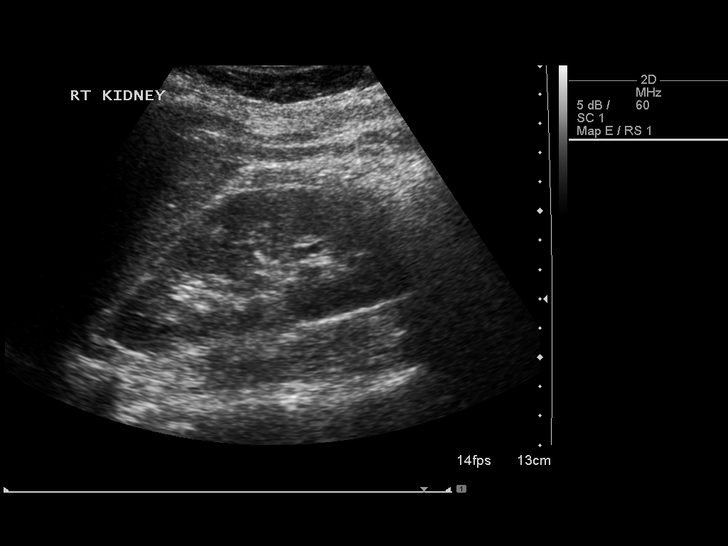
[im 39/63]
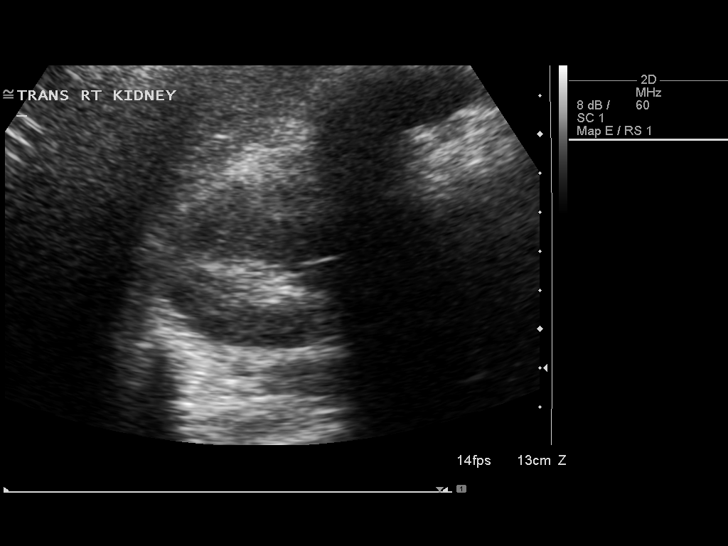
[im 42/63]
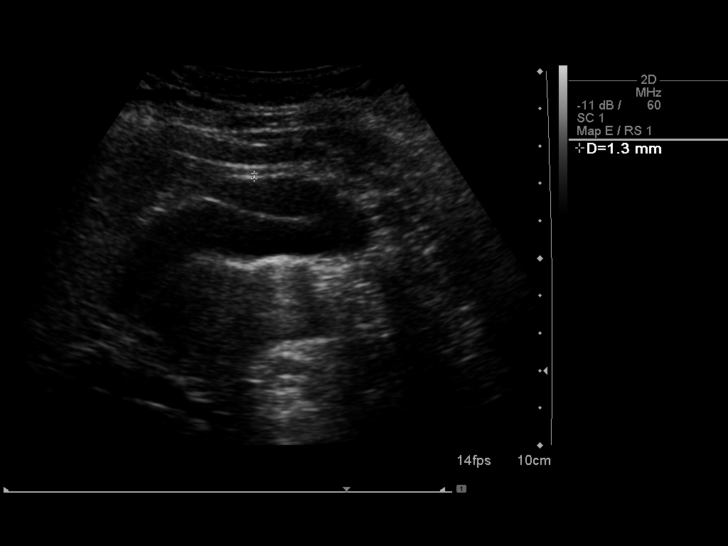
[im 47/63]
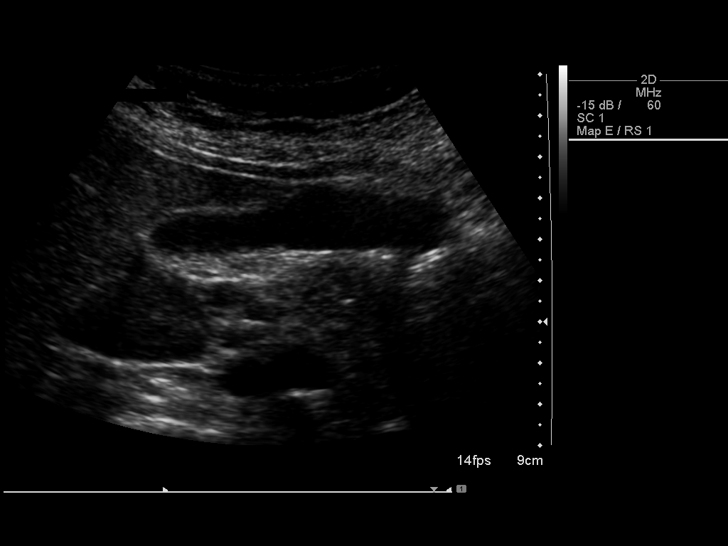
[im 52/63]
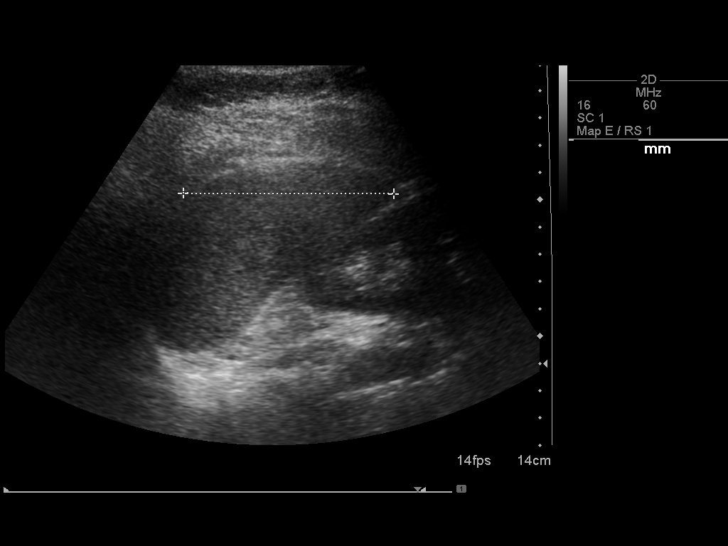
[im 57/63]
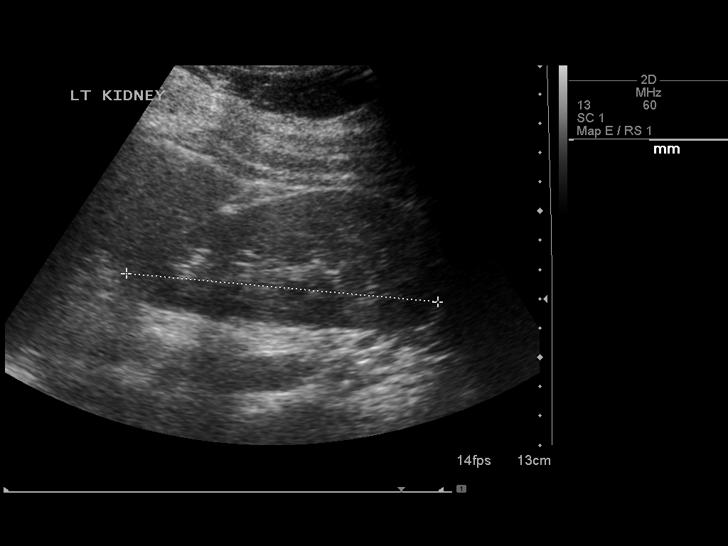
[im 63/63]
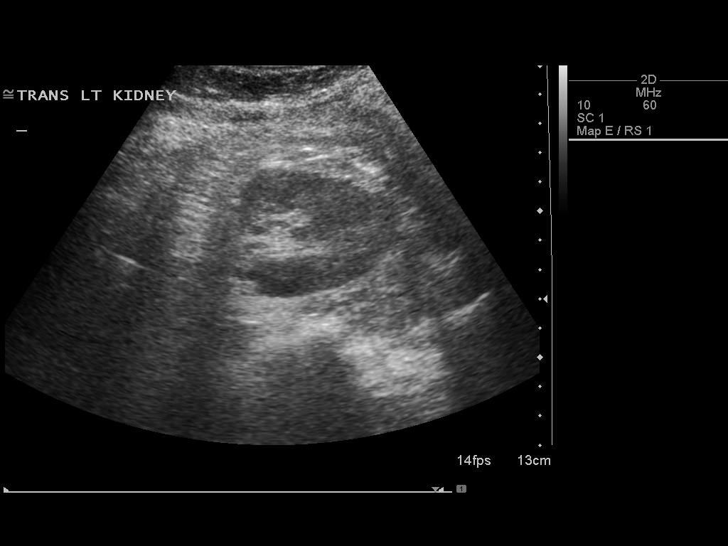

[14 of 25 positions shown; findings below may reference images not displayed]

FINDINGS: Gallbladder:  No gallstones, gallbladder wall thickening, or
pericholecystic fluid. Negative sonographic Murphy's sign.

Common bile duct:  Normal.  3.7 mm maximal diameter.

Liver:  Normal.

IVC:  Normal.

Pancreas:  Normal.

Spleen:  Normal.  7.7 cm in length.

Right Kidney:  Normal.  11.5 cm in length.

Left Kidney:  Normal.  10.7 cm in length.

Abdominal aorta:  Normal.  2.0 cm maximum diameter.
IMPRESSION: Normal exam.

## 2015-05-25 IMAGING — CR DG CHEST 1V PORT
1 series · 1 of 1 positions shown · non-contrast
Comparison: None.

CLINICAL DATA: Postop.  Pulmonary edema.

EXAM:
PORTABLE CHEST - 1 VIEW

[view not recorded]
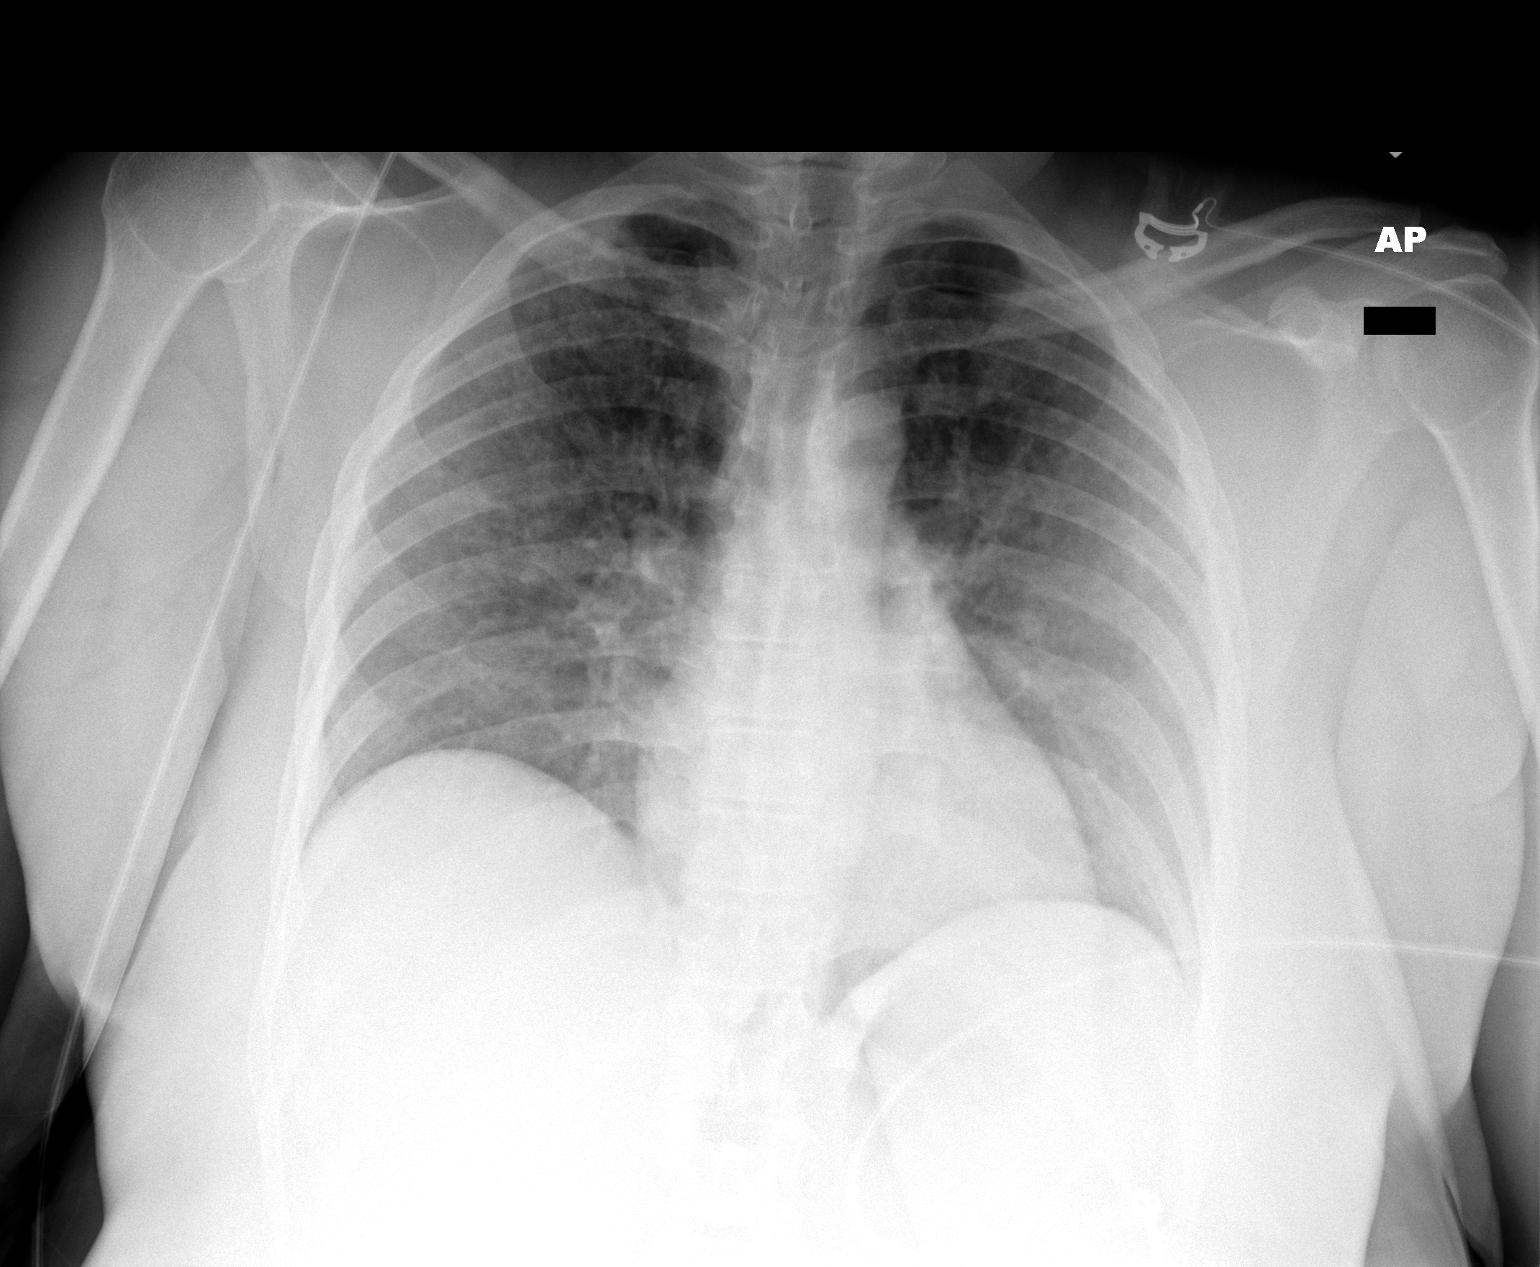

[1 of 1 positions shown; findings below may reference images not displayed]

FINDINGS: Heart size is within normal limits. Mild diffuse interstitial
infiltrates are seen, suspicious for interstitial edema. No evidence
of pulmonary airspace disease or consolidation. No evidence of
pleural effusion or pneumothorax.
IMPRESSION: Mild diffuse interstitial infiltrates, suspicious for interstitial
edema.

## 2022-06-17 ENCOUNTER — Encounter: Payer: Self-pay | Admitting: Emergency Medicine

## 2022-06-17 ENCOUNTER — Ambulatory Visit
Admission: EM | Admit: 2022-06-17 | Discharge: 2022-06-17 | Disposition: A | Discharge status: Home / Self Care | Payer: No Typology Code available for payment source | Attending: Urgent Care | Admitting: Urgent Care

## 2022-06-17 ENCOUNTER — Ambulatory Visit (INDEPENDENT_AMBULATORY_CARE_PROVIDER_SITE_OTHER): Payer: No Typology Code available for payment source

## 2022-06-17 DIAGNOSIS — R0989 Other specified symptoms and signs involving the circulatory and respiratory systems: Secondary | ICD-10-CM

## 2022-06-17 DIAGNOSIS — R058 Other specified cough: Secondary | ICD-10-CM

## 2022-06-17 DIAGNOSIS — I10 Essential (primary) hypertension: Secondary | ICD-10-CM

## 2022-06-17 DIAGNOSIS — J209 Acute bronchitis, unspecified: Secondary | ICD-10-CM

## 2022-06-17 DIAGNOSIS — R059 Cough, unspecified: Secondary | ICD-10-CM

## 2022-06-17 MED ORDER — LEVOCETIRIZINE DIHYDROCHLORIDE 5 MG PO TABS: 5.0000 mg | ORAL_TABLET | Freq: Every evening | ORAL | 0 refills | Status: AC

## 2022-06-17 MED ORDER — PROMETHAZINE-DM 6.25-15 MG/5ML PO SYRP: 5.0000 mL | ORAL_SOLUTION | Freq: Three times a day (TID) | ORAL | 0 refills | Status: AC | PRN

## 2022-06-17 MED ORDER — PREDNISONE 20 MG PO TABS: ORAL_TABLET | ORAL | 0 refills | Status: AC

## 2022-06-17 NOTE — ED Triage Notes (Signed)
Provider triage  

## 2022-06-17 NOTE — ED Provider Notes (Signed)
[facility]   MRN: [id number] DOB: February 06, 1971  Subjective:   Joyce Nelson is a 51 y.o. female presenting for 2+ week history of persistent productive cough, chest congestion, drainage, sinus congestion, hoarseness.  Patient was seen by her regular doctor and underwent a course of amoxicillin but this did not help her.  No history of asthma, respiratory disorders.  No alcohol, smoking or drug use.  No current facility-administered medications for this encounter.  Current Outpatient Medications:    acetaminophen (TYLENOL) 500 MG tablet, Take 1,000 mg by mouth every 6 (six) hours as needed for mild pain or moderate pain., Disp: , Rfl:    Feverfew 380 MG CAPS, Take 1 capsule by mouth daily., Disp: , Rfl:    folic acid (FOLVITE) 510 MCG tablet, Take 800 mcg by mouth daily., Disp: , Rfl:    ibuprofen (ADVIL,MOTRIN) 600 MG tablet, Take 1 tablet (600 mg total) by mouth every 6 (six) hours as needed (mild pain)., Disp: 30 tablet, Rfl: 0   Melatonin 1 MG TABS, Take 1 tablet by mouth as needed., Disp: , Rfl:    olmesartan-hydrochlorothiazide (BENICAR HCT) 40-12.5 MG per tablet, Take 1 tablet by mouth daily., Disp: , Rfl:    psyllium (REGULOID) 0.52 G capsule, Take 3 capsules by mouth daily., Disp: , Rfl:    No Known Allergies  Past Medical History:  Diagnosis Date   Hypertension      Past Surgical History:  Procedure Laterality Date   DILATATION & CURETTAGE/HYSTEROSCOPY WITH TRUECLEAR N/A 12/07/2013   Procedure: DILATATION & CURETTAGE/HYSTEROSCOPY WITH TRUCLEAR;  Surgeon: Betsy Coder, MD;  Location: [facility];  Service: Gynecology;  Laterality: N/A;  REQUEST;APRIL,CHARLES ETTA,ANNETTE   NO PAST SURGERIES      History reviewed. No pertinent family history.  Social History   Tobacco Use   Smoking status: Never  Substance Use Topics   Alcohol use: No   Drug use: No    ROS   Objective:   Vitals: BP (!) 144/81 (BP Location: Left Arm)   Pulse (!) 105    Temp 98.7 F (37.1 C) (Oral)   Resp 15   SpO2 95%   Physical Exam Constitutional:      General: She is not in acute distress.    Appearance: Normal appearance. She is well-developed. She is not ill-appearing, toxic-appearing or diaphoretic.  HENT:     Head: Normocephalic and atraumatic.     Nose: Nose normal.     Mouth/Throat:     Mouth: Mucous membranes are moist.     Pharynx: No pharyngeal swelling, oropharyngeal exudate, posterior oropharyngeal erythema or uvula swelling.     Tonsils: No tonsillar exudate or tonsillar abscesses. 0 on the right. 0 on the left.  Eyes:     General: No scleral icterus.       Right eye: No discharge.        Left eye: No discharge.     Extraocular Movements: Extraocular movements intact.  Cardiovascular:     Rate and Rhythm: Normal rate and regular rhythm.     Heart sounds: Normal heart sounds. No murmur heard.    No friction rub. No gallop.  Pulmonary:     Effort: Pulmonary effort is normal. No respiratory distress.     Breath sounds: No stridor. No wheezing, rhonchi or rales.     Comments: Slightly decreased lung sounds in the mid to bibasilar fields. Chest:     Chest wall: No tenderness.  Skin:  General: Skin is warm and dry.  Neurological:     General: No focal deficit present.     Mental Status: She is alert and oriented to person, place, and time.  Psychiatric:        Mood and Affect: Mood normal.        Behavior: Behavior normal.    DG Chest 2 View  Result Date: 06/17/2022 CLINICAL DATA:  Cough and chest congestion EXAM: CHEST - 2 VIEW COMPARISON:  12/07/2013 FINDINGS: Upper normal heart size. Mediastinal contours and pulmonary vascularity normal. Lungs clear. No pulmonary infiltrate, pleural effusion, or pneumothorax. Osseous structures unremarkable. IMPRESSION: No acute abnormalities. Electronically Signed   By: Lavonia Dana M.D.   On: 06/17/2022 15:39    Assessment and Plan :   PDMP not reviewed this encounter.  1. Acute  bronchitis, unspecified organism   2. Productive cough   3. Chest congestion   4. Essential hypertension    We will manage for acute bronchitis with an oral prednisone course.  Use supportive care otherwise.  Will defer further antibiotic use. Counseled patient on potential for adverse effects with medications prescribed/recommended today, ER and return-to-clinic precautions discussed, patient verbalized understanding.    Jaynee Eagles, [place] 06/17/22 1547

## 2023-06-06 ENCOUNTER — Other Ambulatory Visit: Payer: Self-pay | Admitting: Internal Medicine

## 2023-06-06 DIAGNOSIS — Z1231 Encounter for screening mammogram for malignant neoplasm of breast: Secondary | ICD-10-CM

## 2023-06-10 ENCOUNTER — Ambulatory Visit
Admission: RE | Admit: 2023-06-10 | Discharge: 2023-06-10 | Disposition: A | Discharge status: Home / Self Care | Payer: No Typology Code available for payment source | Source: Ambulatory Visit | Attending: Internal Medicine | Admitting: Internal Medicine

## 2023-06-10 DIAGNOSIS — Z1231 Encounter for screening mammogram for malignant neoplasm of breast: Secondary | ICD-10-CM

## 2024-09-07 ENCOUNTER — Other Ambulatory Visit: Payer: Self-pay | Admitting: Internal Medicine

## 2024-09-07 DIAGNOSIS — Z1231 Encounter for screening mammogram for malignant neoplasm of breast: Secondary | ICD-10-CM

## 2024-09-21 ENCOUNTER — Ambulatory Visit
Admission: RE | Admit: 2024-09-21 | Discharge: 2024-09-21 | Disposition: A | Discharge status: Home / Self Care | Source: Ambulatory Visit | Attending: Internal Medicine | Admitting: Internal Medicine

## 2024-09-21 DIAGNOSIS — Z1231 Encounter for screening mammogram for malignant neoplasm of breast: Secondary | ICD-10-CM

## 2024-09-27 ENCOUNTER — Other Ambulatory Visit: Payer: Self-pay | Admitting: Internal Medicine

## 2024-09-27 DIAGNOSIS — R928 Other abnormal and inconclusive findings on diagnostic imaging of breast: Secondary | ICD-10-CM

## 2024-10-12 ENCOUNTER — Ambulatory Visit
Admission: RE | Admit: 2024-10-12 | Discharge: 2024-10-12 | Disposition: A | Discharge status: Home / Self Care | Source: Ambulatory Visit | Attending: Internal Medicine | Admitting: Internal Medicine

## 2024-10-12 DIAGNOSIS — R928 Other abnormal and inconclusive findings on diagnostic imaging of breast: Secondary | ICD-10-CM
